# Patient Record
Sex: Female | Born: 1965 | Hispanic: No | Marital: Married
Health system: Southern US, Community
[De-identification: ages and names within clinical notes are randomized; demographics above are authoritative.]

## PROBLEM LIST (undated history)

## (undated) DIAGNOSIS — M797 Fibromyalgia: Secondary | ICD-10-CM

## (undated) DIAGNOSIS — G4730 Sleep apnea, unspecified: Secondary | ICD-10-CM

## (undated) DIAGNOSIS — M1990 Unspecified osteoarthritis, unspecified site: Secondary | ICD-10-CM

## (undated) DIAGNOSIS — K5010 Crohn's disease of large intestine without complications: Secondary | ICD-10-CM

---

## 2003-09-27 ENCOUNTER — Ambulatory Visit (HOSPITAL_COMMUNITY): Admission: RE | Admit: 2003-09-27 | Discharge: 2003-09-27 | Payer: Self-pay | Admitting: Urology

## 2005-02-01 IMAGING — CR DG ABDOMEN 1V
1 series · 1 of 1 positions shown · non-contrast
Comparison: none

CLINICAL DATA: Preop for left renal calculus. 
 KUB
 No prior studies for comparison. 
 There is a 14 x 9 mm calculus (uncorrected for magnification) in the left kidney.  No definite ureteral calculi.

[view not recorded]
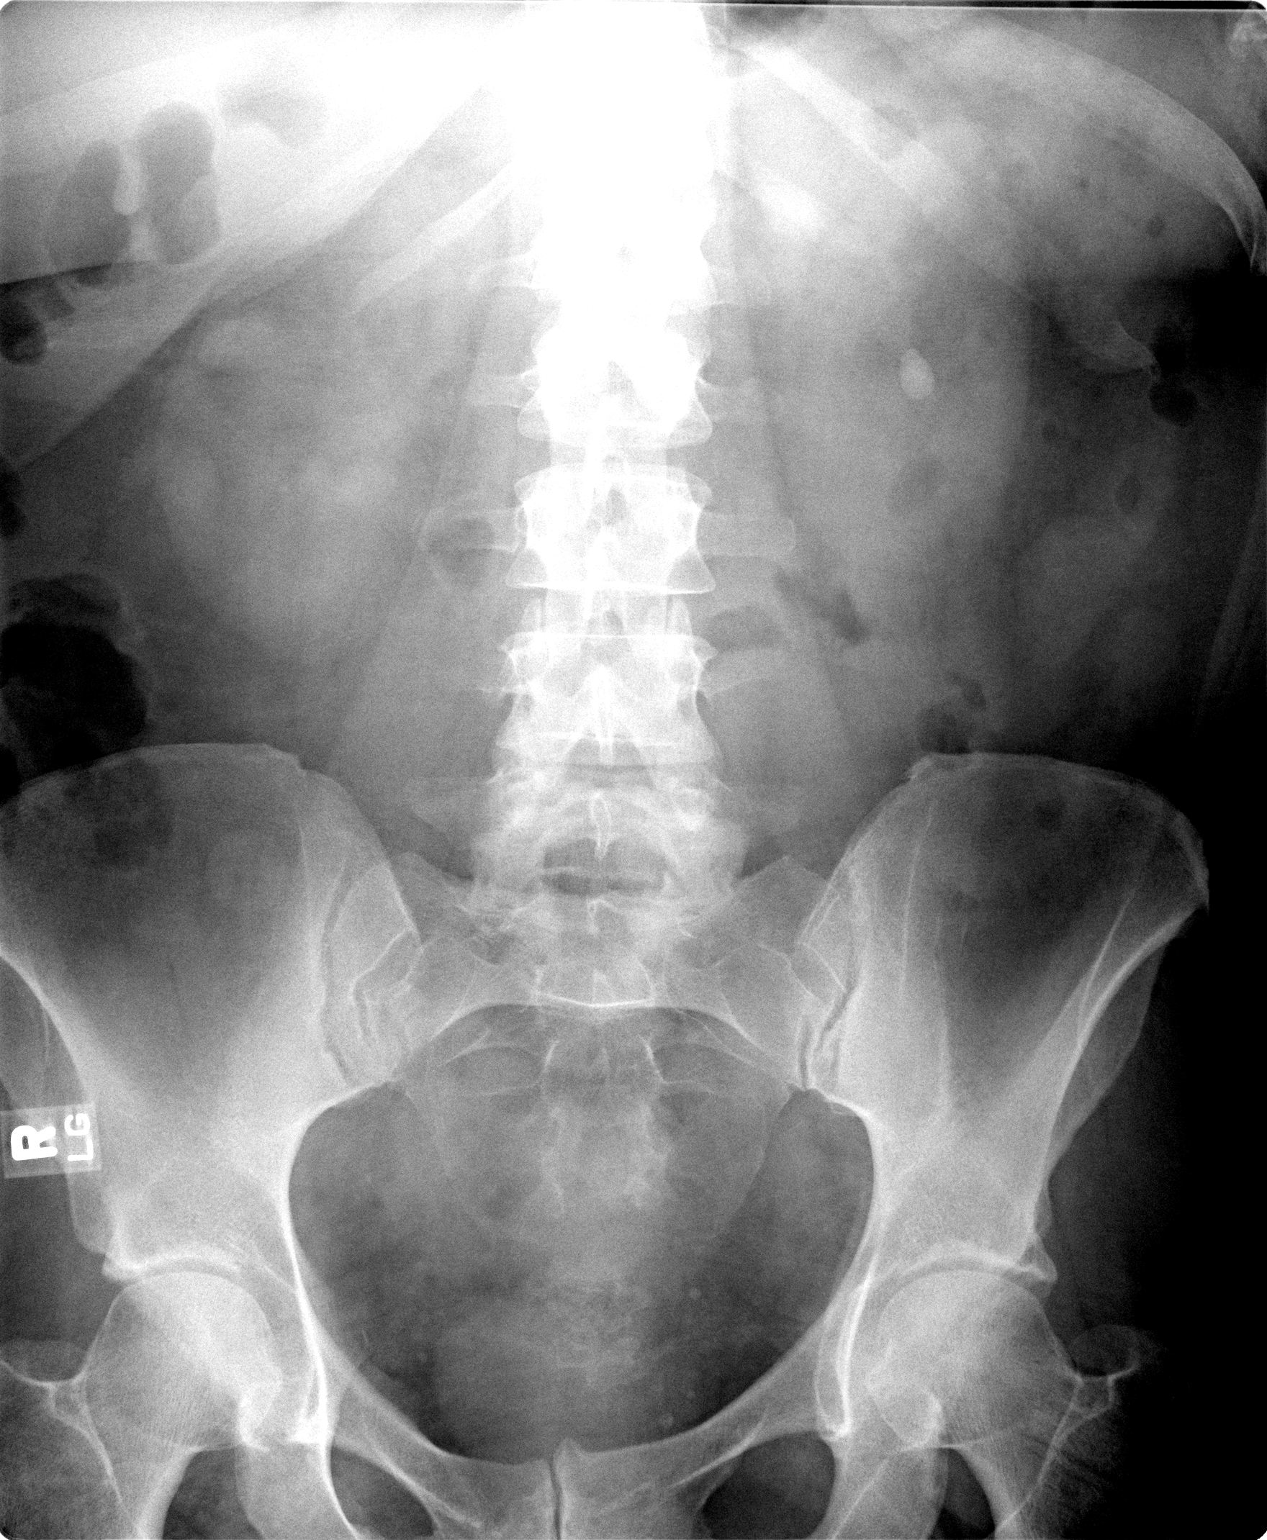

[1 of 1 positions shown; findings below may reference images not displayed]

IMPRESSION: Left renal calculus.

## 2010-01-26 ENCOUNTER — Encounter: Payer: Self-pay | Admitting: Urology

## 2021-02-22 ENCOUNTER — Encounter (HOSPITAL_BASED_OUTPATIENT_CLINIC_OR_DEPARTMENT_OTHER): Payer: Self-pay | Admitting: Emergency Medicine

## 2021-02-22 ENCOUNTER — Other Ambulatory Visit: Payer: Self-pay

## 2021-02-22 ENCOUNTER — Emergency Department (HOSPITAL_BASED_OUTPATIENT_CLINIC_OR_DEPARTMENT_OTHER)
Admission: EM | Admit: 2021-02-22 | Discharge: 2021-02-22 | Disposition: A | Discharge status: Home / Self Care | Payer: BC Managed Care – PPO | Attending: Emergency Medicine | Admitting: Emergency Medicine

## 2021-02-22 DIAGNOSIS — K5900 Constipation, unspecified: Secondary | ICD-10-CM | POA: Diagnosis present

## 2021-02-22 DIAGNOSIS — K5641 Fecal impaction: Secondary | ICD-10-CM | POA: Insufficient documentation

## 2021-02-22 HISTORY — DX: Unspecified osteoarthritis, unspecified site: M19.90

## 2021-02-22 HISTORY — DX: Crohn's disease of large intestine without complications: K50.10

## 2021-02-22 HISTORY — DX: Fibromyalgia: M79.7

## 2021-02-22 HISTORY — DX: Sleep apnea, unspecified: G47.30

## 2021-02-22 NOTE — ED Triage Notes (Signed)
Pt states she has fecal impaction that feels the size of a softball and needs a professional enema. Pt states has tried OTC medications for constipation and nothing is helping.

## 2021-02-22 NOTE — ED Provider Notes (Signed)
[facility] Emergency Department Provider Note MRN:  [id number]  Arrival date & time: 02/22/21     Chief Complaint   Constipation   History of Present Illness   Kelsey Harmon is a 56 y.o. year-old female with a history of Crohn's presenting to the ED with chief complaint of constipation.  Patient has had issues with bowel movements for the past few days.  Feels like she has a baseball sized stool that she cannot pass.  Requesting enema.  Review of Systems  A thorough review of systems was obtained and all systems are negative except as noted in the HPI and PMH.   Patient's Health History    Past Medical History:  Diagnosis Date   Crohn's colitis ([place])    Fibromyalgia    Osteoarthritis    Sleep apnea       No family history on file.  Social History   Socioeconomic History   Marital status: Married    Spouse name: Not on file   Number of children: Not on file   Years of education: Not on file   Highest education level: Not on file  Occupational History   Not on file  Tobacco Use   Smoking status: Not on file   Smokeless tobacco: Not on file  Substance and Sexual Activity   Alcohol use: Not on file   Drug use: Not on file   Sexual activity: Not on file  Other Topics Concern   Not on file  Social History Narrative   Not on file   Social Determinants of Health   Financial Resource Strain: Not on file  Food Insecurity: Not on file  Transportation Needs: Not on file  Physical Activity: Not on file  Stress: Not on file  Social Connections: Not on file  Intimate Partner Violence: Not on file     Physical Exam   Vitals:   02/22/21 1550  BP: 122/78  Pulse: (!) 112  Resp: 18  Temp: 99 F (37.2 C)  SpO2: 98%    CONSTITUTIONAL: Well-appearing, NAD NEURO/PSYCH:  Alert and oriented x 3, no focal deficits EYES:  eyes equal and reactive ENT/NECK:  no LAD, no JVD CARDIO: Regular rate, well-perfused, normal S1 and S2 PULM:  CTAB no  wheezing or rhonchi GI/GU:  non-distended, non-tender MSK/SPINE:  No gross deformities, no edema SKIN:  no rash, atraumatic   *Additional and/or pertinent findings included in MDM below  Diagnostic and Interventional Summary    EKG Interpretation  Date/Time:    Ventricular Rate:    PR Interval:    QRS Duration:   QT Interval:    QTC Calculation:   R Axis:     Text Interpretation:         Labs Reviewed - No data to display  No orders to display    Medications - No data to display   Procedures  /  Critical Care Procedures  ED Course and Medical Decision Making  Initial Impression and Ddx Suspect fecal impaction based on patient's history.  Abdomen soft and nontender.  Fecal disimpaction offered but declined by patient, who is requesting enema.  Enema is successful, patient's symptoms are resolved, appropriate for discharge.  Past medical/surgical history that increases complexity of ED encounter: Crohn's  Interpretation of Diagnostics None  Patient Reassessment and Ultimate Disposition/Management Discharge home  Patient management required discussion with the following services or consulting groups:  None  Complexity of Problems Addressed Acute complicated illness or Injury  Additional Data  Reviewed and Analyzed Further history obtained from: None  Additional Factors Impacting ED Encounter Risk None  Barth Kirks. [place]onia Small, [place] [email or web address]  Final Clinical Impressions(s) / ED Diagnoses     ICD-10-CM   1. Fecal impaction ([place])  K56.41       ED Discharge Orders     None        Discharge Instructions Discussed with and Provided to Patient:    Discharge Instructions      You were evaluated in the Emergency Department and after careful evaluation, we did not find any emergent condition requiring admission or further testing in the hospital.  Your exam/testing today was overall  reassuring.  Recommend continued stool softeners to avoid this issue in the future.  Over-the-counter MiraLAX is our recommendation, can be taken up to 6 times daily.  Please return to the Emergency Department if you experience any worsening of your condition.  Thank you for allowing [place] to be a part of your care.       Maudie Flakes, MD 02/22/21 1911

## 2021-02-22 NOTE — Discharge Instructions (Signed)
You were evaluated in the Emergency Department and after careful evaluation, we did not find any emergent condition requiring admission or further testing in the hospital.  Your exam/testing today was overall reassuring.  Recommend continued stool softeners to avoid this issue in the future.  Over-the-counter MiraLAX is our recommendation, can be taken up to 6 times daily.  Please return to the Emergency Department if you experience any worsening of your condition.  Thank you for allowing [place] to be a part of your care.
